# Patient Record
Sex: Male | Born: 1965
Health system: Southern US, Community
[De-identification: ages and names within clinical notes are randomized; demographics above are authoritative.]

---

## 2000-04-29 ENCOUNTER — Ambulatory Visit (HOSPITAL_COMMUNITY): Admission: RE | Admit: 2000-04-29 | Discharge: 2000-04-29 | Payer: Self-pay | Admitting: Gastroenterology

## 2003-11-28 ENCOUNTER — Encounter: Admission: RE | Admit: 2003-11-28 | Discharge: 2003-11-28 | Payer: Self-pay | Admitting: Family Medicine

## 2011-09-28 ENCOUNTER — Ambulatory Visit: Payer: Self-pay | Admitting: Family Medicine

## 2015-11-05 DIAGNOSIS — Z125 Encounter for screening for malignant neoplasm of prostate: Secondary | ICD-10-CM | POA: Diagnosis not present

## 2015-11-05 DIAGNOSIS — E785 Hyperlipidemia, unspecified: Secondary | ICD-10-CM | POA: Diagnosis not present

## 2015-11-05 DIAGNOSIS — Z0000 Encounter for general adult medical examination without abnormal findings: Secondary | ICD-10-CM | POA: Diagnosis not present

## 2015-11-20 DIAGNOSIS — Z8719 Personal history of other diseases of the digestive system: Secondary | ICD-10-CM | POA: Diagnosis not present

## 2016-03-09 DIAGNOSIS — M791 Myalgia: Secondary | ICD-10-CM | POA: Diagnosis not present

## 2016-03-09 DIAGNOSIS — M7542 Impingement syndrome of left shoulder: Secondary | ICD-10-CM | POA: Diagnosis not present

## 2016-03-09 DIAGNOSIS — M25512 Pain in left shoulder: Secondary | ICD-10-CM | POA: Diagnosis not present

## 2016-03-12 DIAGNOSIS — M25512 Pain in left shoulder: Secondary | ICD-10-CM | POA: Diagnosis not present

## 2016-03-12 DIAGNOSIS — M7542 Impingement syndrome of left shoulder: Secondary | ICD-10-CM | POA: Diagnosis not present

## 2016-03-12 DIAGNOSIS — M791 Myalgia: Secondary | ICD-10-CM | POA: Diagnosis not present

## 2016-03-18 DIAGNOSIS — M7542 Impingement syndrome of left shoulder: Secondary | ICD-10-CM | POA: Diagnosis not present

## 2016-03-18 DIAGNOSIS — M25512 Pain in left shoulder: Secondary | ICD-10-CM | POA: Diagnosis not present

## 2016-03-18 DIAGNOSIS — M791 Myalgia: Secondary | ICD-10-CM | POA: Diagnosis not present

## 2016-03-24 DIAGNOSIS — M791 Myalgia: Secondary | ICD-10-CM | POA: Diagnosis not present

## 2016-03-24 DIAGNOSIS — M7542 Impingement syndrome of left shoulder: Secondary | ICD-10-CM | POA: Diagnosis not present

## 2016-03-24 DIAGNOSIS — M25512 Pain in left shoulder: Secondary | ICD-10-CM | POA: Diagnosis not present

## 2016-03-26 DIAGNOSIS — M7542 Impingement syndrome of left shoulder: Secondary | ICD-10-CM | POA: Diagnosis not present

## 2016-03-26 DIAGNOSIS — M25512 Pain in left shoulder: Secondary | ICD-10-CM | POA: Diagnosis not present

## 2016-03-26 DIAGNOSIS — M791 Myalgia: Secondary | ICD-10-CM | POA: Diagnosis not present

## 2016-04-08 DIAGNOSIS — M25512 Pain in left shoulder: Secondary | ICD-10-CM | POA: Diagnosis not present

## 2016-04-08 DIAGNOSIS — M791 Myalgia: Secondary | ICD-10-CM | POA: Diagnosis not present

## 2016-04-08 DIAGNOSIS — M7542 Impingement syndrome of left shoulder: Secondary | ICD-10-CM | POA: Diagnosis not present

## 2016-04-27 DIAGNOSIS — M791 Myalgia: Secondary | ICD-10-CM | POA: Diagnosis not present

## 2016-04-27 DIAGNOSIS — M7542 Impingement syndrome of left shoulder: Secondary | ICD-10-CM | POA: Diagnosis not present

## 2016-04-27 DIAGNOSIS — M25512 Pain in left shoulder: Secondary | ICD-10-CM | POA: Diagnosis not present

## 2016-10-27 DIAGNOSIS — R6889 Other general symptoms and signs: Secondary | ICD-10-CM | POA: Diagnosis not present

## 2016-10-27 DIAGNOSIS — J111 Influenza due to unidentified influenza virus with other respiratory manifestations: Secondary | ICD-10-CM | POA: Diagnosis not present

## 2016-11-09 DIAGNOSIS — E7800 Pure hypercholesterolemia, unspecified: Secondary | ICD-10-CM | POA: Diagnosis not present

## 2016-11-09 DIAGNOSIS — Z0000 Encounter for general adult medical examination without abnormal findings: Secondary | ICD-10-CM | POA: Diagnosis not present

## 2016-11-09 DIAGNOSIS — Z125 Encounter for screening for malignant neoplasm of prostate: Secondary | ICD-10-CM | POA: Diagnosis not present

## 2016-11-09 DIAGNOSIS — Z1211 Encounter for screening for malignant neoplasm of colon: Secondary | ICD-10-CM | POA: Diagnosis not present

## 2016-12-10 DIAGNOSIS — Z209 Contact with and (suspected) exposure to unspecified communicable disease: Secondary | ICD-10-CM | POA: Diagnosis not present

## 2016-12-10 DIAGNOSIS — Z23 Encounter for immunization: Secondary | ICD-10-CM | POA: Diagnosis not present

## 2016-12-10 DIAGNOSIS — E7800 Pure hypercholesterolemia, unspecified: Secondary | ICD-10-CM | POA: Diagnosis not present

## 2017-01-11 DIAGNOSIS — Z23 Encounter for immunization: Secondary | ICD-10-CM | POA: Diagnosis not present

## 2017-02-02 ENCOUNTER — Emergency Department (HOSPITAL_COMMUNITY): Payer: BLUE CROSS/BLUE SHIELD

## 2017-02-02 ENCOUNTER — Emergency Department (HOSPITAL_COMMUNITY)
Admission: EM | Admit: 2017-02-02 | Discharge: 2017-02-02 | Disposition: A | Discharge status: Home / Self Care | Payer: BLUE CROSS/BLUE SHIELD | Attending: Emergency Medicine | Admitting: Emergency Medicine

## 2017-02-02 ENCOUNTER — Encounter (HOSPITAL_COMMUNITY): Payer: Self-pay | Admitting: Emergency Medicine

## 2017-02-02 DIAGNOSIS — K5792 Diverticulitis of intestine, part unspecified, without perforation or abscess without bleeding: Secondary | ICD-10-CM | POA: Diagnosis not present

## 2017-02-02 DIAGNOSIS — M87052 Idiopathic aseptic necrosis of left femur: Secondary | ICD-10-CM | POA: Diagnosis not present

## 2017-02-02 DIAGNOSIS — M8700 Idiopathic aseptic necrosis of unspecified bone: Secondary | ICD-10-CM

## 2017-02-02 DIAGNOSIS — M87051 Idiopathic aseptic necrosis of right femur: Secondary | ICD-10-CM | POA: Diagnosis not present

## 2017-02-02 DIAGNOSIS — K5730 Diverticulosis of large intestine without perforation or abscess without bleeding: Secondary | ICD-10-CM | POA: Diagnosis not present

## 2017-02-02 DIAGNOSIS — I96 Gangrene, not elsewhere classified: Secondary | ICD-10-CM | POA: Diagnosis not present

## 2017-02-02 DIAGNOSIS — R1032 Left lower quadrant pain: Secondary | ICD-10-CM | POA: Diagnosis not present

## 2017-02-02 DIAGNOSIS — K5732 Diverticulitis of large intestine without perforation or abscess without bleeding: Secondary | ICD-10-CM | POA: Diagnosis not present

## 2017-02-02 LAB — COMPREHENSIVE METABOLIC PANEL
ALBUMIN: 4.9 g/dL (ref 3.5–5.0)
ALT: 39 U/L (ref 17–63)
AST: 29 U/L (ref 15–41)
Alkaline Phosphatase: 60 U/L (ref 38–126)
Anion gap: 10 (ref 5–15)
BILIRUBIN TOTAL: 1.7 mg/dL — AB (ref 0.3–1.2)
BUN: 16 mg/dL (ref 6–20)
CHLORIDE: 104 mmol/L (ref 101–111)
CO2: 25 mmol/L (ref 22–32)
Calcium: 9.5 mg/dL (ref 8.9–10.3)
Creatinine, Ser: 1.02 mg/dL (ref 0.61–1.24)
GFR calc Af Amer: >60 mL/min (ref >60)
GFR calc non Af Amer: >60 mL/min (ref >60)
GLUCOSE: 112 mg/dL — AB (ref 65–99)
POTASSIUM: 3.8 mmol/L (ref 3.5–5.1)
Sodium: 139 mmol/L (ref 135–145)
Total Protein: 7.9 g/dL (ref 6.5–8.1)

## 2017-02-02 LAB — CBC
HEMATOCRIT: 38.9 % — AB (ref 39.0–52.0)
Hemoglobin: 13.5 g/dL (ref 13.0–17.0)
MCH: 30.6 pg (ref 26.0–34.0)
MCHC: 34.7 g/dL (ref 30.0–36.0)
MCV: 88.2 fL (ref 78.0–100.0)
Platelets: 236 10*3/uL (ref 150–400)
RBC: 4.41 MIL/uL (ref 4.22–5.81)
RDW: 11.9 % (ref 11.5–15.5)
WBC: 11.8 10*3/uL — ABNORMAL HIGH (ref 4.0–10.5)

## 2017-02-02 LAB — URINALYSIS, ROUTINE W REFLEX MICROSCOPIC
BILIRUBIN URINE: NEGATIVE
GLUCOSE, UA: NEGATIVE mg/dL
Hgb urine dipstick: NEGATIVE
KETONES UR: NEGATIVE mg/dL
Leukocytes, UA: NEGATIVE
Nitrite: NEGATIVE
PH: 5 (ref 5.0–8.0)
Protein, ur: NEGATIVE mg/dL
Specific Gravity, Urine: 1.008 (ref 1.005–1.030)

## 2017-02-02 LAB — LIPASE, BLOOD: LIPASE: 19 U/L (ref 11–51)

## 2017-02-02 MED ORDER — METRONIDAZOLE 500 MG PO TABS: 500.0000 mg | ORAL_TABLET | Freq: Two times a day (BID) | ORAL | 0 refills | Status: AC

## 2017-02-02 MED ORDER — ONDANSETRON 8 MG PO TBDP: 8.0000 mg | ORAL_TABLET | Freq: Three times a day (TID) | ORAL | 0 refills | Status: AC | PRN

## 2017-02-02 MED ORDER — CIPROFLOXACIN HCL 500 MG PO TABS
500.0000 mg | ORAL_TABLET | Freq: Once | ORAL | Status: AC
  Administered 2017-02-02: 500 mg via ORAL
  Filled 2017-02-02: qty 1

## 2017-02-02 MED ORDER — IOPAMIDOL (ISOVUE-300) INJECTION 61%
100.0000 mL | Freq: Once | INTRAVENOUS | Status: AC | PRN
  Administered 2017-02-02: 100 mL via INTRAVENOUS

## 2017-02-02 MED ORDER — HYDROCODONE-ACETAMINOPHEN 5-325 MG PO TABS
1.0000 | ORAL_TABLET | Freq: Once | ORAL | Status: AC
Start: 2017-02-02 — End: 2017-02-02
  Administered 2017-02-02: 1 via ORAL
  Filled 2017-02-02: qty 1

## 2017-02-02 MED ORDER — MORPHINE SULFATE (PF) 2 MG/ML IV SOLN
4.0000 mg | Freq: Once | INTRAVENOUS | Status: AC
  Administered 2017-02-02: 4 mg via INTRAVENOUS
  Filled 2017-02-02: qty 2

## 2017-02-02 MED ORDER — CIPROFLOXACIN HCL 500 MG PO TABS: 500.0000 mg | ORAL_TABLET | Freq: Three times a day (TID) | ORAL | 0 refills | Status: AC

## 2017-02-02 MED ORDER — MORPHINE SULFATE (PF) 2 MG/ML IV SOLN
4.0000 mg | Freq: Once | INTRAVENOUS | Status: AC
Start: 2017-02-02 — End: 2017-02-02
  Administered 2017-02-02: 4 mg via INTRAVENOUS
  Filled 2017-02-02: qty 2

## 2017-02-02 MED ORDER — SODIUM CHLORIDE 0.9 % IV BOLUS (SEPSIS)
500.0000 mL | Freq: Once | INTRAVENOUS | Status: AC
  Administered 2017-02-02: 500 mL via INTRAVENOUS

## 2017-02-02 MED ORDER — IOPAMIDOL (ISOVUE-300) INJECTION 61%
INTRAVENOUS | Status: AC
  Filled 2017-02-02: qty 100

## 2017-02-02 MED ORDER — OXYCODONE HCL 5 MG PO TABS: 5.0000 mg | ORAL_TABLET | ORAL | 0 refills | Status: AC | PRN

## 2017-02-02 MED ORDER — ONDANSETRON HCL 4 MG/2ML IJ SOLN
4.0000 mg | Freq: Once | INTRAMUSCULAR | Status: AC
  Administered 2017-02-02: 4 mg via INTRAVENOUS
  Filled 2017-02-02: qty 2

## 2017-02-02 MED ORDER — METRONIDAZOLE 500 MG PO TABS
500.0000 mg | ORAL_TABLET | Freq: Once | ORAL | Status: AC
  Administered 2017-02-02: 500 mg via ORAL
  Filled 2017-02-02: qty 1

## 2017-02-02 NOTE — Discharge Instructions (Signed)
Have discussed your CAT scan findings with you. You are going to be treated for diverticulitis. Make sure you take the antibiotics as prescribed. Had been giving your first dose of antibiotics in the ED. Use the pain medicine as needed. May take Tylenol and Motrin as needed for pain. May use Zofran as needed for nausea. She drink plenty of fluids. Bowel rest for the first 24-48 hours with clear liquid diet and advance as tolerated. Patient to follow up with her GI doctor and your primary care doctor. Return to the ED if he develops any worsening symptoms.

## 2017-02-02 NOTE — ED Provider Notes (Signed)
[facility]-EMERGENCY DEPT Provider Note   CSN: [id number] Arrival date & time: 02/02/17  0844     History   Chief Complaint Chief Complaint  Patient presents with  . Abdominal Pain    HPI Tyrone Brown is a 51 y.o. male.  HPI 51 yo Caucasian male with no sign pmh presents to the ED with complaint of LLQ abd pain. Pt states the pain started last night. Describes it as sharp, stabbing, constant and gradually worsening. Pt has not tried any medication at home for his pain. Nothing makes better. Moving or walking makes the pain worse.   Pt denies any fever, chill, ha, vision changes, lightheadedness, dizziness, congestion, neck pain, cp, sob, cough, n/v/d, urinary symptoms, change in bowel habits, melena, hematochezia, lower extremity paresthesias.  History reviewed. No pertinent past medical history.  There are no active problems to display for this patient.   History reviewed. No pertinent surgical history.     Home Medications    Prior to Admission medications   Not on File    Family History No family history on file.  Social History Social History  Substance Use Topics  . Smoking status: Not on file  . Smokeless tobacco: Not on file  . Alcohol use Not on file     Allergies   Patient has no known allergies.   Review of Systems Review of Systems  Constitutional: Negative for chills and fever.  HENT: Negative for congestion and sore throat.   Eyes: Negative for visual disturbance.  Respiratory: Negative for cough and shortness of breath.   Cardiovascular: Negative for chest pain.  Gastrointestinal: Positive for abdominal pain. Negative for diarrhea, nausea and vomiting.  Genitourinary: Negative for dysuria, flank pain, frequency, hematuria, scrotal swelling, testicular pain and urgency.  Musculoskeletal: Negative for arthralgias and myalgias.  Skin: Negative for rash.  Neurological: Negative for dizziness, syncope, weakness, light-headedness, numbness and  headaches.  Psychiatric/Behavioral: Negative for sleep disturbance. The patient is not nervous/anxious.      Physical Exam Updated Vital Signs BP 113/84 (BP Location: Left Arm)   Pulse 89   Temp 98.3 F (36.8 C) (Oral)   Resp 18   SpO2 96%   Physical Exam  Constitutional: He is oriented to person, place, and time. He appears well-developed and well-nourished.  Non-toxic appearance. No distress.  HENT:  Head: Normocephalic and atraumatic.  Mouth/Throat: Oropharynx is clear and moist.  Eyes: Conjunctivae are normal. Pupils are equal, round, and reactive to light. Right eye exhibits no discharge. Left eye exhibits no discharge.  Neck: Normal range of motion. Neck supple.  Cardiovascular: Normal rate, regular rhythm, normal heart sounds and intact distal pulses.  Exam reveals no gallop and no friction rub.   No murmur heard. Pulmonary/Chest: Effort normal and breath sounds normal. No respiratory distress. He exhibits no tenderness.  Abdominal: Soft. Bowel sounds are normal. He exhibits no distension. There is tenderness in the left lower quadrant. There is guarding (voluntary). There is no rigidity, no rebound, no CVA tenderness, no tenderness at McBurney's point and negative Murphy's sign.  Musculoskeletal: Normal range of motion. He exhibits no tenderness.  Lymphadenopathy:    He has no cervical adenopathy.  Neurological: He is alert and oriented to person, place, and time.  Skin: Skin is warm and dry. Capillary refill takes less than 2 seconds. No rash noted.  Psychiatric: His behavior is normal. Judgment and thought content normal.  Nursing note and vitals reviewed.    ED Treatments / Results  Labs (  all labs ordered are listed, but only abnormal results are displayed) Labs Reviewed  COMPREHENSIVE METABOLIC PANEL - Abnormal; Notable for the following:       Result Value   Glucose, Bld 112 (*)    Total Bilirubin 1.7 (*)    All other components within normal limits  CBC -  Abnormal; Notable for the following:    WBC 11.8 (*)    HCT 38.9 (*)    All other components within normal limits  URINALYSIS, ROUTINE W REFLEX MICROSCOPIC - Abnormal; Notable for the following:    Color, Urine STRAW (*)    All other components within normal limits  LIPASE, BLOOD    EKG  EKG Interpretation None       Radiology Ct Abdomen Pelvis W Contrast  Result Date: 02/02/2017 CLINICAL DATA:  Left lower quadrant region pain EXAM: CT ABDOMEN AND PELVIS WITH CONTRAST TECHNIQUE: Multidetector CT imaging of the abdomen and pelvis was performed using the standard protocol following bolus administration of intravenous contrast. CONTRAST:  ISOVUE-300 IOPAMIDOL (ISOVUE-300) INJECTION 61% COMPARISON:  None. FINDINGS: Lower chest: Lung bases are clear. Hepatobiliary: No focal liver lesions are appreciable. Gallbladder wall does not appear thickened. There is no biliary duct dilatation. Pancreas: There is no pancreatic mass or inflammatory focus. Spleen: No splenic lesions are evident. Adrenals/Urinary Tract: Adrenals appear normal bilaterally. Kidneys bilaterally show no evident mass. There is an extrarenal pelvis on each side, an anatomic variant. There is no appreciable hydronephrosis on either side. There is no renal or ureteral calculus on either side. The urinary bladder is midline with wall thickness within normal limits. Stomach/Bowel: There is wall thickening in the proximal sigmoid colon region with an irregular diverticulum in the proximal sigmoid colon region. There is adjacent mesenteric fluid and stranding consistent with diverticulitis in the proximal descending colon region. There is thickening at the adjacent lateral conal fascia. There is no abscess or perforation in this area. More distally in the sigmoid colon, there are multiple diverticula without inflammation. Elsewhere, there is no bowel wall or mesenteric thickening. There is no bowel obstruction. No free air or portal  venous air. Vascular/Lymphatic: There are foci of atherosclerotic calcification in the aorta. No abdominal aortic aneurysm. Major mesenteric vessels appear patent. Reproductive: Prostate and seminal vesicles appear normal in size and contour. There is no pelvic mass evident. Other: Appendix not appreciable. No periappendiceal region inflammation. No abscess or ascites noted in the abdomen or pelvis. There is a minimal ventral hernia containing only fat. Musculoskeletal: There are foci of calcification in each femoral head, areas felt to be indicative of a degree of bilateral femoral head avascular necrosis. These changes are more extensive on the right than on the left. There is evidence of old trauma with exostosis formation along the anterior right pubic symphysis. No blastic or lytic bone lesions are identified. No intramuscular or abdominal wall lesions. IMPRESSION: 1. Proximal sigmoid colon diverticulitis with localized wall thickening and fluid but no frank abscess or perforation. Irregular diverticulum noted in this area. 2. Multiple diverticula throughout the mid and distal sigmoid colon without diverticulitis more distally. 3. No bowel obstruction. No abscess. No periappendiceal region inflammation. 4. Findings indicative of a degree of avascular necrosis in each femoral head, somewhat more extensive on the right than on the left. 5. Evidence of old trauma involving the right pubic symphysis with an exostosis anteriorly in this area, benign in appearance. 6.  Aortic atherosclerosis. 7.  No renal or ureteral calculus.  No hydronephrosis.  8.  Minimal ventral hernia containing only fat. Aortic Atherosclerosis (ICD10-I70.0). Electronically Signed   By: Bretta Bang III M.D.   On: 02/02/2017 11:30    Procedures Procedures (including critical care time)  Medications Ordered in ED Medications  morphine 2 MG/ML injection 4 mg (4 mg Intravenous Given 02/02/17 0957)  sodium chloride 0.9 % bolus 500 mL (0  mLs Intravenous Stopped 02/02/17 1138)  ondansetron (ZOFRAN) injection 4 mg (4 mg Intravenous Given 02/02/17 0957)  iopamidol (ISOVUE-300) 61 % injection 100 mL (100 mLs Intravenous Contrast Given 02/02/17 1107)  morphine 2 MG/ML injection 4 mg (4 mg Intravenous Given 02/02/17 1224)  HYDROcodone-acetaminophen (NORCO/VICODIN) 5-325 MG per tablet 1 tablet (1 tablet Oral Given 02/02/17 1224)  ciprofloxacin (CIPRO) tablet 500 mg (500 mg Oral Given 02/02/17 1224)  metroNIDAZOLE (FLAGYL) tablet 500 mg (500 mg Oral Given 02/02/17 1224)     Initial Impression / Assessment and Plan / ED Course  I have reviewed the triage vital signs and the nursing notes.  Pertinent labs & imaging results that were available during my care of the patient were reviewed by me and considered in my medical decision making (see chart for details).     Patient presents to the emergency Department today with complaints of left lower quadrant abdominal pain. He denies any associated symptoms including fever, nausea, vomiting, diarrhea. Patient does have left lower quadrant tenderness to palpation. No peritoneal signs concerning for surgical abdomen. Patient does not meet SIRS or SEPSIS criteria. Patient is afebrile. No tachycardia or hypotension. Patient with mild leukocytosis of 11,000. All other labs unremarkable. UA shows no signs of infection. Concerning for diverticulitis. We'll obtain CT scan.  CT scan does show uncomplicated diverticulitis or abscess or perforation. Other CT findings discussed with patient including signs of avascular necrosis.  Encourage patient to follow-up with his primary care doctor. Patient is able to keep down by mouth fluids or food. Pain has been managed in the ED. Repeat abdominal exam is benign without any signs of peritonitis. Patient given first dose of antibiotic in the ED. Will be discharged home with antibiotic and pain medicine. Encourage patient to follow-up with his GI doctor and PCP. Patient  verbalized understanding the plan of care and all questions were answered prior to discharge. Vital signs remained stable and he is hemodynamically stable on discharge.  Dicussed pt with Dr. Jacqulyn Bath who is agreeable to the above plan.   Final Clinical Impressions(s) / ED Diagnoses   Final diagnoses:  Diverticulitis  Avascular necrosis (HCC)    New Prescriptions Discharge Medication List as of 02/02/2017 12:29 PM    START taking these medications   Details  ciprofloxacin (CIPRO) 500 MG tablet Take 1 tablet (500 mg total) by mouth 3 (three) times daily., Starting Tue 02/02/2017, Print    metroNIDAZOLE (FLAGYL) 500 MG tablet Take 1 tablet (500 mg total) by mouth 2 (two) times daily with a meal. DO NOT CONSUME ALCOHOL WHILE TAKING THIS MEDICATION., Starting Tue 02/02/2017, Print    ondansetron (ZOFRAN-ODT) 8 MG disintegrating tablet Take 1 tablet (8 mg total) by mouth every 8 (eight) hours as needed for nausea., Starting Tue 02/02/2017, Print    oxyCODONE (ROXICODONE) 5 MG immediate release tablet Take 1 tablet (5 mg total) by mouth every 4 (four) hours as needed for severe pain., Starting Tue 02/02/2017, Print         Rise Mu, PA-C 02/02/17 1615    Maia Plan, MD 02/02/17 [phone]

## 2017-02-02 NOTE — ED Triage Notes (Signed)
Patient c/o constant LLQ pain since last night. Denies N/V/D.

## 2017-02-18 DIAGNOSIS — K5732 Diverticulitis of large intestine without perforation or abscess without bleeding: Secondary | ICD-10-CM | POA: Diagnosis not present

## 2017-03-02 DIAGNOSIS — Z23 Encounter for immunization: Secondary | ICD-10-CM | POA: Diagnosis not present

## 2017-04-13 DIAGNOSIS — Z8719 Personal history of other diseases of the digestive system: Secondary | ICD-10-CM | POA: Diagnosis not present

## 2017-04-13 DIAGNOSIS — K5792 Diverticulitis of intestine, part unspecified, without perforation or abscess without bleeding: Secondary | ICD-10-CM | POA: Diagnosis not present

## 2017-06-18 DIAGNOSIS — Z23 Encounter for immunization: Secondary | ICD-10-CM | POA: Diagnosis not present

## 2017-07-07 DIAGNOSIS — K529 Noninfective gastroenteritis and colitis, unspecified: Secondary | ICD-10-CM | POA: Diagnosis not present

## 2017-07-07 DIAGNOSIS — K640 First degree hemorrhoids: Secondary | ICD-10-CM | POA: Diagnosis not present

## 2017-07-14 DIAGNOSIS — K529 Noninfective gastroenteritis and colitis, unspecified: Secondary | ICD-10-CM | POA: Diagnosis not present

## 2017-07-31 DIAGNOSIS — J069 Acute upper respiratory infection, unspecified: Secondary | ICD-10-CM | POA: Diagnosis not present

## 2017-07-31 DIAGNOSIS — J029 Acute pharyngitis, unspecified: Secondary | ICD-10-CM | POA: Diagnosis not present

## 2017-09-29 DIAGNOSIS — H01001 Unspecified blepharitis right upper eyelid: Secondary | ICD-10-CM | POA: Diagnosis not present

## 2017-11-11 DIAGNOSIS — E7800 Pure hypercholesterolemia, unspecified: Secondary | ICD-10-CM | POA: Diagnosis not present

## 2017-11-11 DIAGNOSIS — Z125 Encounter for screening for malignant neoplasm of prostate: Secondary | ICD-10-CM | POA: Diagnosis not present

## 2017-11-11 DIAGNOSIS — Z0000 Encounter for general adult medical examination without abnormal findings: Secondary | ICD-10-CM | POA: Diagnosis not present

## 2018-05-01 ENCOUNTER — Emergency Department (HOSPITAL_COMMUNITY)
Admission: EM | Admit: 2018-05-01 | Discharge: 2018-05-01 | Disposition: A | Discharge status: Home / Self Care | Payer: BLUE CROSS/BLUE SHIELD | Attending: Emergency Medicine | Admitting: Emergency Medicine

## 2018-05-01 ENCOUNTER — Other Ambulatory Visit: Payer: Self-pay

## 2018-05-01 DIAGNOSIS — Y9389 Activity, other specified: Secondary | ICD-10-CM | POA: Diagnosis not present

## 2018-05-01 DIAGNOSIS — Y999 Unspecified external cause status: Secondary | ICD-10-CM | POA: Insufficient documentation

## 2018-05-01 DIAGNOSIS — Y929 Unspecified place or not applicable: Secondary | ICD-10-CM | POA: Diagnosis not present

## 2018-05-01 DIAGNOSIS — S51811A Laceration without foreign body of right forearm, initial encounter: Secondary | ICD-10-CM | POA: Diagnosis not present

## 2018-05-01 DIAGNOSIS — W271XXA Contact with garden tool, initial encounter: Secondary | ICD-10-CM | POA: Insufficient documentation

## 2018-05-01 DIAGNOSIS — Z79899 Other long term (current) drug therapy: Secondary | ICD-10-CM | POA: Insufficient documentation

## 2018-05-01 DIAGNOSIS — Z23 Encounter for immunization: Secondary | ICD-10-CM | POA: Diagnosis not present

## 2018-05-01 MED ORDER — TETANUS-DIPHTH-ACELL PERTUSSIS 5-2.5-18.5 LF-MCG/0.5 IM SUSP
0.5000 mL | Freq: Once | INTRAMUSCULAR | Status: AC
  Administered 2018-05-01: 0.5 mL via INTRAMUSCULAR
  Filled 2018-05-01: qty 0.5

## 2018-05-01 MED ORDER — LIDOCAINE-EPINEPHRINE (PF) 2 %-1:200000 IJ SOLN
10.0000 mL | Freq: Once | INTRAMUSCULAR | Status: AC
  Administered 2018-05-01: 10 mL
  Filled 2018-05-01: qty 20

## 2018-05-01 NOTE — ED Provider Notes (Signed)
[facility]-EMERGENCY DEPT Provider Note   CSN: [id number] Arrival date & time: 05/01/18  1251     History   Chief Complaint Chief Complaint  Patient presents with  . Extremity Laceration    HPI Tyrone Brown is a 52 y.o. male.  Tyrone Brown is a 52 y.o. y.o. Male presents for evaluation of extremity laceration.  Patient reports this afternoon he was working outside when he cut his right forearm with a pair of garden shears when they slipped while working in the yard.  They did not go very deep laceration appears fairly superficial.  He has no numbness or weakness, no difficulty moving the elbow, wrist or hand.  He is unsure when his last tetanus shot was.  Reports some mild pain immediately around the laceration, worse with palpation, no medications prior to arrival.  No other aggravating or alleviating factors.      No past medical history on file.  There are no active problems to display for this patient.   No past surgical history on file.      Home Medications    Prior to Admission medications   Medication Sig Start Date End Date Taking? Authorizing Provider  atorvastatin (LIPITOR) 10 MG tablet Take 10 mg by mouth at bedtime. 11/09/16   [provider]  ciprofloxacin (CIPRO) 500 MG tablet Take 1 tablet (500 mg total) by mouth 3 (three) times daily. 02/02/17   Rise Mu, PA-C  metroNIDAZOLE (FLAGYL) 500 MG tablet Take 1 tablet (500 mg total) by mouth 2 (two) times daily with a meal. DO NOT CONSUME ALCOHOL WHILE TAKING THIS MEDICATION. 02/02/17   Rise Mu, PA-C  Multiple Vitamin (MULTIVITAMIN WITH MINERALS) TABS tablet Take 1 tablet by mouth daily.    [provider]  ondansetron (ZOFRAN-ODT) 8 MG disintegrating tablet Take 1 tablet (8 mg total) by mouth every 8 (eight) hours as needed for nausea. 02/02/17   Rise Mu, PA-C  oxyCODONE (ROXICODONE) 5 MG immediate release tablet Take 1 tablet (5 mg total) by  mouth every 4 (four) hours as needed for severe pain. 02/02/17   Rise Mu, PA-C    Family History No family history on file.  Social History Social History   Tobacco Use  . Smoking status: Not on file  Substance Use Topics  . Alcohol use: Not on file  . Drug use: Not on file     Allergies   Patient has no known allergies.   Review of Systems Review of Systems  Constitutional: Negative for chills and fever.  Skin: Positive for wound.  Neurological: Negative for weakness and numbness.     Physical Exam Updated Vital Signs BP 117/78 (BP Location: Right Arm)   Pulse 80   Temp 98.3 F (36.8 C) (Oral)   Resp 18   Ht 5\' 11"  (1.803 m)   Wt 81.6 kg   BMI 25.10 kg/m   Physical Exam  Constitutional: He appears well-developed and well-nourished. No distress.  HENT:  Head: Normocephalic and atraumatic.  Mouth/Throat: Oropharynx is clear and moist.  Eyes: Right eye exhibits no discharge. Left eye exhibits no discharge.  Neck: Neck supple.  Pulmonary/Chest: Effort normal. No respiratory distress.  Musculoskeletal:  3.5 cm laceration to the mid right forearm, bleeding controlled, patient has 5/5 grip strength and normal movement of all fingers, sensation is intact throughout the arm.  No evidence of foreign bodies and the wound is fairly superficial.  Neurological: He is alert. Coordination  normal.  Skin: Skin is warm and dry. Capillary refill takes less than 2 seconds. He is not diaphoretic.  Psychiatric: He has a normal mood and affect. His behavior is normal.  Nursing note and vitals reviewed.    ED Treatments / Results  Labs (all labs ordered are listed, but only abnormal results are displayed) Labs Reviewed - No data to display  EKG None  Radiology No results found.  Procedures .Marland KitchenLaceration Repair Date/Time: 05/01/2018 2:40 PM Performed by: Dartha Lodge, PA-C Authorized by: Dartha Lodge, PA-C   Consent:    Consent obtained:  Verbal    Consent given by:  Patient   Risks discussed:  Infection, pain, need for additional repair, poor cosmetic result and poor wound healing   Alternatives discussed:  No treatment Anesthesia (see MAR for exact dosages):    Anesthesia method:  Local infiltration   Local anesthetic:  Lidocaine 2% WITH epi Laceration details:    Location:  Shoulder/arm   Shoulder/arm location:  R lower arm   Length (cm):  3.5   Depth (mm):  5 Repair type:    Repair type:  Simple Pre-procedure details:    Preparation:  Imaging obtained to evaluate for foreign bodies Exploration:    Hemostasis achieved with:  LET and direct pressure   Wound exploration: entire depth of wound probed and visualized     Wound extent: areolar tissue violated     Wound extent: no nerve damage noted and no tendon damage noted   Treatment:    Area cleansed with:  Saline   Amount of cleaning:  Standard   Irrigation solution:  Sterile saline Skin repair:    Repair method:  Sutures   Suture size:  4-0   Suture material:  Prolene   Suture technique:  Simple interrupted   Number of sutures:  4 Approximation:    Approximation:  Close Post-procedure details:    Dressing:  Antibiotic ointment and adhesive bandage   Patient tolerance of procedure:  Tolerated well, no immediate complications   (including critical care time)  Medications Ordered in ED Medications  lidocaine-EPINEPHrine (XYLOCAINE W/EPI) 2 %-1:200000 (PF) injection 10 mL (10 mLs Infiltration Given 05/01/18 1416)  Tdap (BOOSTRIX) injection 0.5 mL (0.5 mLs Intramuscular Given 05/01/18 1417)     Initial Impression / Assessment and Plan / ED Course  I have reviewed the triage vital signs and the nursing notes.  Pertinent labs & imaging results that were available during my care of the patient were reviewed by me and considered in my medical decision making (see chart for details).  Patient presents for laceration to the right forearm, appears fairly superficial,  bleeding is controlled.  No evidence of nerve or tendon damage in the arm is neurovascularly intact.  Tetanus updated here in the ED.  The area was cleaned, and anesthetized with lidocaine.  Wound repaired with simple interrupted sutures and tolerated extremely well by the patient.  Good cosmesis.  Suture removal in 7 to 10 days. Discussed appropriate wound care and infection return precautions.  Patient expresses understanding and is in agreement with plan.  Discharge home.  Final Clinical Impressions(s) / ED Diagnoses   Final diagnoses:  Laceration of right forearm, initial encounter    ED Discharge Orders    None       Dartha Lodge, [place] 05/01/18 1453    Virgina Norfolk, DO 05/01/18 1857

## 2018-05-01 NOTE — ED Triage Notes (Signed)
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 Patient is rating his pain at 3/10 at this time.

## 2018-05-01 NOTE — Discharge Instructions (Signed)
You will need to have your stitches removed in 7 to 10 days.  In the meantime he can keep the area clean and dry, covered with antibiotic ointment and a bandage.  You may shower but do not submerge the arm underwater.  Monitor closely for signs of infection such as redness, warmth, swelling, increasing pain, drainage or fevers if any of these occur please return for sooner reevaluation.

## 2018-05-10 DIAGNOSIS — Z4802 Encounter for removal of sutures: Secondary | ICD-10-CM | POA: Diagnosis not present

## 2018-05-10 DIAGNOSIS — Z23 Encounter for immunization: Secondary | ICD-10-CM | POA: Diagnosis not present

## 2018-05-10 DIAGNOSIS — R002 Palpitations: Secondary | ICD-10-CM | POA: Diagnosis not present

## 2018-11-16 DIAGNOSIS — S83242A Other tear of medial meniscus, current injury, left knee, initial encounter: Secondary | ICD-10-CM | POA: Diagnosis not present

## 2018-11-21 DIAGNOSIS — Z0000 Encounter for general adult medical examination without abnormal findings: Secondary | ICD-10-CM | POA: Diagnosis not present

## 2018-12-08 DIAGNOSIS — L570 Actinic keratosis: Secondary | ICD-10-CM | POA: Diagnosis not present

## 2018-12-08 DIAGNOSIS — D2272 Melanocytic nevi of left lower limb, including hip: Secondary | ICD-10-CM | POA: Diagnosis not present

## 2018-12-08 DIAGNOSIS — L820 Inflamed seborrheic keratosis: Secondary | ICD-10-CM | POA: Diagnosis not present

## 2018-12-08 DIAGNOSIS — D225 Melanocytic nevi of trunk: Secondary | ICD-10-CM | POA: Diagnosis not present

## 2018-12-08 DIAGNOSIS — L814 Other melanin hyperpigmentation: Secondary | ICD-10-CM | POA: Diagnosis not present

## 2018-12-08 DIAGNOSIS — L821 Other seborrheic keratosis: Secondary | ICD-10-CM | POA: Diagnosis not present

## 2019-09-12 ENCOUNTER — Ambulatory Visit: Payer: Self-pay | Attending: Internal Medicine

## 2019-09-12 DIAGNOSIS — U071 COVID-19: Secondary | ICD-10-CM | POA: Insufficient documentation

## 2019-09-12 DIAGNOSIS — Z20822 Contact with and (suspected) exposure to covid-19: Secondary | ICD-10-CM

## 2019-09-13 LAB — NOVEL CORONAVIRUS, NAA: SARS-CoV-2, NAA: DETECTED — AB

## 2020-02-08 DIAGNOSIS — L570 Actinic keratosis: Secondary | ICD-10-CM | POA: Diagnosis not present

## 2020-02-08 DIAGNOSIS — D225 Melanocytic nevi of trunk: Secondary | ICD-10-CM | POA: Diagnosis not present

## 2020-02-08 DIAGNOSIS — D2272 Melanocytic nevi of left lower limb, including hip: Secondary | ICD-10-CM | POA: Diagnosis not present

## 2020-02-08 DIAGNOSIS — L819 Disorder of pigmentation, unspecified: Secondary | ICD-10-CM | POA: Diagnosis not present

## 2020-02-08 DIAGNOSIS — L814 Other melanin hyperpigmentation: Secondary | ICD-10-CM | POA: Diagnosis not present

## 2020-03-08 DIAGNOSIS — E7800 Pure hypercholesterolemia, unspecified: Secondary | ICD-10-CM | POA: Diagnosis not present

## 2020-03-29 DIAGNOSIS — M9905 Segmental and somatic dysfunction of pelvic region: Secondary | ICD-10-CM | POA: Diagnosis not present

## 2020-03-29 DIAGNOSIS — M9904 Segmental and somatic dysfunction of sacral region: Secondary | ICD-10-CM | POA: Diagnosis not present

## 2020-03-29 DIAGNOSIS — M9903 Segmental and somatic dysfunction of lumbar region: Secondary | ICD-10-CM | POA: Diagnosis not present

## 2020-03-29 DIAGNOSIS — M5442 Lumbago with sciatica, left side: Secondary | ICD-10-CM | POA: Diagnosis not present

## 2020-04-12 DIAGNOSIS — M5442 Lumbago with sciatica, left side: Secondary | ICD-10-CM | POA: Diagnosis not present

## 2020-04-12 DIAGNOSIS — M9905 Segmental and somatic dysfunction of pelvic region: Secondary | ICD-10-CM | POA: Diagnosis not present

## 2020-04-12 DIAGNOSIS — M9904 Segmental and somatic dysfunction of sacral region: Secondary | ICD-10-CM | POA: Diagnosis not present

## 2020-04-12 DIAGNOSIS — M9903 Segmental and somatic dysfunction of lumbar region: Secondary | ICD-10-CM | POA: Diagnosis not present

## 2020-05-08 DIAGNOSIS — M25552 Pain in left hip: Secondary | ICD-10-CM | POA: Diagnosis not present

## 2020-05-08 DIAGNOSIS — M5106 Intervertebral disc disorders with myelopathy, lumbar region: Secondary | ICD-10-CM | POA: Diagnosis not present

## 2020-05-24 DIAGNOSIS — Z23 Encounter for immunization: Secondary | ICD-10-CM | POA: Diagnosis not present

## 2020-05-24 DIAGNOSIS — F419 Anxiety disorder, unspecified: Secondary | ICD-10-CM | POA: Diagnosis not present

## 2020-07-05 DIAGNOSIS — M545 Low back pain, unspecified: Secondary | ICD-10-CM | POA: Diagnosis not present

## 2020-07-15 DIAGNOSIS — M5416 Radiculopathy, lumbar region: Secondary | ICD-10-CM | POA: Diagnosis not present

## 2020-08-01 DIAGNOSIS — M5416 Radiculopathy, lumbar region: Secondary | ICD-10-CM | POA: Diagnosis not present

## 2020-08-08 DIAGNOSIS — Z1211 Encounter for screening for malignant neoplasm of colon: Secondary | ICD-10-CM | POA: Diagnosis not present

## 2020-08-08 DIAGNOSIS — Z0000 Encounter for general adult medical examination without abnormal findings: Secondary | ICD-10-CM | POA: Diagnosis not present

## 2020-08-08 DIAGNOSIS — Z125 Encounter for screening for malignant neoplasm of prostate: Secondary | ICD-10-CM | POA: Diagnosis not present

## 2020-08-08 DIAGNOSIS — E7800 Pure hypercholesterolemia, unspecified: Secondary | ICD-10-CM | POA: Diagnosis not present

## 2020-08-08 DIAGNOSIS — M5416 Radiculopathy, lumbar region: Secondary | ICD-10-CM | POA: Diagnosis not present

## 2020-08-08 DIAGNOSIS — F419 Anxiety disorder, unspecified: Secondary | ICD-10-CM | POA: Diagnosis not present

## 2020-08-13 DIAGNOSIS — M5416 Radiculopathy, lumbar region: Secondary | ICD-10-CM | POA: Diagnosis not present

## 2020-08-29 DIAGNOSIS — M5416 Radiculopathy, lumbar region: Secondary | ICD-10-CM | POA: Diagnosis not present

## 2020-09-02 DIAGNOSIS — M5416 Radiculopathy, lumbar region: Secondary | ICD-10-CM | POA: Diagnosis not present

## 2020-09-16 DIAGNOSIS — M5416 Radiculopathy, lumbar region: Secondary | ICD-10-CM | POA: Diagnosis not present

## 2020-09-19 DIAGNOSIS — M5416 Radiculopathy, lumbar region: Secondary | ICD-10-CM | POA: Diagnosis not present

## 2020-09-25 DIAGNOSIS — L821 Other seborrheic keratosis: Secondary | ICD-10-CM | POA: Diagnosis not present

## 2020-10-22 DIAGNOSIS — M5416 Radiculopathy, lumbar region: Secondary | ICD-10-CM | POA: Diagnosis not present

## 2020-11-12 DIAGNOSIS — M5416 Radiculopathy, lumbar region: Secondary | ICD-10-CM | POA: Diagnosis not present

## 2020-11-22 DIAGNOSIS — M5416 Radiculopathy, lumbar region: Secondary | ICD-10-CM | POA: Diagnosis not present

## 2020-12-18 DIAGNOSIS — M5116 Intervertebral disc disorders with radiculopathy, lumbar region: Secondary | ICD-10-CM | POA: Diagnosis not present

## 2020-12-18 DIAGNOSIS — M5416 Radiculopathy, lumbar region: Secondary | ICD-10-CM | POA: Diagnosis not present

## 2020-12-25 ENCOUNTER — Other Ambulatory Visit (HOSPITAL_COMMUNITY): Payer: Self-pay | Admitting: Neurological Surgery

## 2020-12-25 DIAGNOSIS — M5416 Radiculopathy, lumbar region: Secondary | ICD-10-CM

## 2020-12-26 ENCOUNTER — Ambulatory Visit (HOSPITAL_COMMUNITY)
Admission: RE | Admit: 2020-12-26 | Discharge: 2020-12-26 | Disposition: A | Discharge status: Home / Self Care | Payer: BC Managed Care – PPO | Source: Ambulatory Visit | Attending: Neurological Surgery | Admitting: Neurological Surgery

## 2020-12-26 ENCOUNTER — Other Ambulatory Visit: Payer: Self-pay

## 2020-12-26 DIAGNOSIS — M4726 Other spondylosis with radiculopathy, lumbar region: Secondary | ICD-10-CM | POA: Diagnosis not present

## 2020-12-26 DIAGNOSIS — M5416 Radiculopathy, lumbar region: Secondary | ICD-10-CM | POA: Diagnosis not present

## 2020-12-26 DIAGNOSIS — M5116 Intervertebral disc disorders with radiculopathy, lumbar region: Secondary | ICD-10-CM | POA: Diagnosis not present

## 2020-12-26 DIAGNOSIS — M48061 Spinal stenosis, lumbar region without neurogenic claudication: Secondary | ICD-10-CM | POA: Diagnosis not present

## 2020-12-26 DIAGNOSIS — Z9889 Other specified postprocedural states: Secondary | ICD-10-CM | POA: Diagnosis not present

## 2021-01-03 DIAGNOSIS — Z9889 Other specified postprocedural states: Secondary | ICD-10-CM | POA: Diagnosis not present

## 2021-01-03 DIAGNOSIS — M5116 Intervertebral disc disorders with radiculopathy, lumbar region: Secondary | ICD-10-CM | POA: Diagnosis not present

## 2021-01-03 DIAGNOSIS — M5126 Other intervertebral disc displacement, lumbar region: Secondary | ICD-10-CM | POA: Diagnosis not present

## 2021-01-06 ENCOUNTER — Ambulatory Visit (HOSPITAL_COMMUNITY)
Admission: RE | Admit: 2021-01-06 | Discharge: 2021-01-06 | Disposition: A | Discharge status: Home / Self Care | Payer: BC Managed Care – PPO | Source: Ambulatory Visit | Attending: Neurosurgery | Admitting: Neurosurgery

## 2021-01-06 ENCOUNTER — Other Ambulatory Visit (HOSPITAL_COMMUNITY): Payer: Self-pay | Admitting: Neurosurgery

## 2021-01-06 ENCOUNTER — Other Ambulatory Visit (HOSPITAL_BASED_OUTPATIENT_CLINIC_OR_DEPARTMENT_OTHER): Payer: Self-pay | Admitting: Neurosurgery

## 2021-01-06 ENCOUNTER — Other Ambulatory Visit: Payer: Self-pay

## 2021-01-06 DIAGNOSIS — M5416 Radiculopathy, lumbar region: Secondary | ICD-10-CM | POA: Insufficient documentation

## 2021-01-06 DIAGNOSIS — M545 Low back pain, unspecified: Secondary | ICD-10-CM | POA: Diagnosis not present

## 2021-01-06 MED ORDER — GADOBUTROL 1 MMOL/ML IV SOLN
8.5000 mL | Freq: Once | INTRAVENOUS | Status: AC | PRN
  Administered 2021-01-06: 8.5 mL via INTRAVENOUS

## 2021-01-22 DIAGNOSIS — M79605 Pain in left leg: Secondary | ICD-10-CM | POA: Diagnosis not present

## 2021-01-22 DIAGNOSIS — M5416 Radiculopathy, lumbar region: Secondary | ICD-10-CM | POA: Diagnosis not present

## 2021-01-22 DIAGNOSIS — M6281 Muscle weakness (generalized): Secondary | ICD-10-CM | POA: Diagnosis not present

## 2021-01-29 DIAGNOSIS — M6281 Muscle weakness (generalized): Secondary | ICD-10-CM | POA: Diagnosis not present

## 2021-01-29 DIAGNOSIS — M79605 Pain in left leg: Secondary | ICD-10-CM | POA: Diagnosis not present

## 2021-01-29 DIAGNOSIS — M5416 Radiculopathy, lumbar region: Secondary | ICD-10-CM | POA: Diagnosis not present

## 2021-02-05 DIAGNOSIS — M79605 Pain in left leg: Secondary | ICD-10-CM | POA: Diagnosis not present

## 2021-02-05 DIAGNOSIS — M5416 Radiculopathy, lumbar region: Secondary | ICD-10-CM | POA: Diagnosis not present

## 2021-02-05 DIAGNOSIS — M6281 Muscle weakness (generalized): Secondary | ICD-10-CM | POA: Diagnosis not present

## 2021-02-13 DIAGNOSIS — M6281 Muscle weakness (generalized): Secondary | ICD-10-CM | POA: Diagnosis not present

## 2021-02-13 DIAGNOSIS — M5416 Radiculopathy, lumbar region: Secondary | ICD-10-CM | POA: Diagnosis not present

## 2021-02-13 DIAGNOSIS — M79605 Pain in left leg: Secondary | ICD-10-CM | POA: Diagnosis not present

## 2021-02-19 DIAGNOSIS — M5416 Radiculopathy, lumbar region: Secondary | ICD-10-CM | POA: Diagnosis not present

## 2021-02-19 DIAGNOSIS — M6281 Muscle weakness (generalized): Secondary | ICD-10-CM | POA: Diagnosis not present

## 2021-02-19 DIAGNOSIS — M79605 Pain in left leg: Secondary | ICD-10-CM | POA: Diagnosis not present

## 2021-03-02 DIAGNOSIS — M25562 Pain in left knee: Secondary | ICD-10-CM | POA: Diagnosis not present

## 2021-03-04 DIAGNOSIS — M79605 Pain in left leg: Secondary | ICD-10-CM | POA: Diagnosis not present

## 2021-03-04 DIAGNOSIS — M6281 Muscle weakness (generalized): Secondary | ICD-10-CM | POA: Diagnosis not present

## 2021-03-04 DIAGNOSIS — M5416 Radiculopathy, lumbar region: Secondary | ICD-10-CM | POA: Diagnosis not present

## 2021-03-12 DIAGNOSIS — M5416 Radiculopathy, lumbar region: Secondary | ICD-10-CM | POA: Diagnosis not present

## 2021-03-12 DIAGNOSIS — M79605 Pain in left leg: Secondary | ICD-10-CM | POA: Diagnosis not present

## 2021-03-12 DIAGNOSIS — M6281 Muscle weakness (generalized): Secondary | ICD-10-CM | POA: Diagnosis not present

## 2021-03-19 DIAGNOSIS — M6281 Muscle weakness (generalized): Secondary | ICD-10-CM | POA: Diagnosis not present

## 2021-03-19 DIAGNOSIS — M79605 Pain in left leg: Secondary | ICD-10-CM | POA: Diagnosis not present

## 2021-03-19 DIAGNOSIS — M5416 Radiculopathy, lumbar region: Secondary | ICD-10-CM | POA: Diagnosis not present

## 2021-03-26 DIAGNOSIS — M5416 Radiculopathy, lumbar region: Secondary | ICD-10-CM | POA: Diagnosis not present

## 2021-03-26 DIAGNOSIS — M6281 Muscle weakness (generalized): Secondary | ICD-10-CM | POA: Diagnosis not present

## 2021-03-26 DIAGNOSIS — M79605 Pain in left leg: Secondary | ICD-10-CM | POA: Diagnosis not present

## 2021-06-04 DIAGNOSIS — D2272 Melanocytic nevi of left lower limb, including hip: Secondary | ICD-10-CM | POA: Diagnosis not present

## 2021-06-04 DIAGNOSIS — C44519 Basal cell carcinoma of skin of other part of trunk: Secondary | ICD-10-CM | POA: Diagnosis not present

## 2021-06-04 DIAGNOSIS — D485 Neoplasm of uncertain behavior of skin: Secondary | ICD-10-CM | POA: Diagnosis not present

## 2021-06-04 DIAGNOSIS — L738 Other specified follicular disorders: Secondary | ICD-10-CM | POA: Diagnosis not present

## 2021-06-04 DIAGNOSIS — L918 Other hypertrophic disorders of the skin: Secondary | ICD-10-CM | POA: Diagnosis not present

## 2021-06-04 DIAGNOSIS — L814 Other melanin hyperpigmentation: Secondary | ICD-10-CM | POA: Diagnosis not present

## 2021-07-04 DIAGNOSIS — Z03818 Encounter for observation for suspected exposure to other biological agents ruled out: Secondary | ICD-10-CM | POA: Diagnosis not present

## 2021-07-04 DIAGNOSIS — J069 Acute upper respiratory infection, unspecified: Secondary | ICD-10-CM | POA: Diagnosis not present

## 2021-07-08 DIAGNOSIS — J019 Acute sinusitis, unspecified: Secondary | ICD-10-CM | POA: Diagnosis not present

## 2021-08-26 DIAGNOSIS — M791 Myalgia, unspecified site: Secondary | ICD-10-CM | POA: Diagnosis not present

## 2021-08-26 DIAGNOSIS — Z0000 Encounter for general adult medical examination without abnormal findings: Secondary | ICD-10-CM | POA: Diagnosis not present

## 2021-08-26 DIAGNOSIS — E7800 Pure hypercholesterolemia, unspecified: Secondary | ICD-10-CM | POA: Diagnosis not present

## 2021-08-26 DIAGNOSIS — F419 Anxiety disorder, unspecified: Secondary | ICD-10-CM | POA: Diagnosis not present

## 2021-08-26 DIAGNOSIS — Z125 Encounter for screening for malignant neoplasm of prostate: Secondary | ICD-10-CM | POA: Diagnosis not present

## 2022-02-19 IMAGING — MR MR LUMBAR SPINE W/O CM
4 of 5 series · 19 of 48 positions shown · non-contrast
Comparison: MRI lumbar spine 07/05/2020.

CLINICAL DATA: Patient status post lumbar surgery 1 week ago.
Lumbar radiculopathy.

EXAM:
MRI LUMBAR SPINE WITHOUT CONTRAST
TECHNIQUE: Multiplanar, multisequence MR imaging of the lumbar spine was
performed. No intravenous contrast was administered.

[Series 2: T2 · sagittal · 4.0mm · 0.55mm/px · 5 of 17 slices shown (1 of 2)]
[im 1/17]
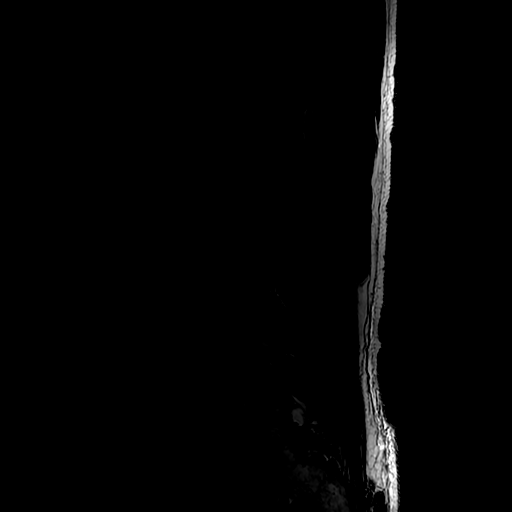
[im 5/17]
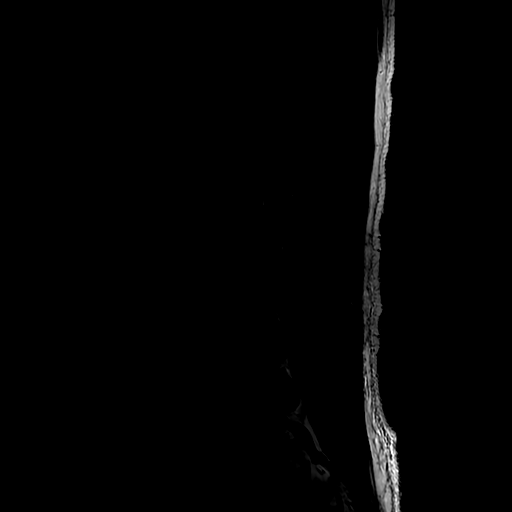
[im 9/17]
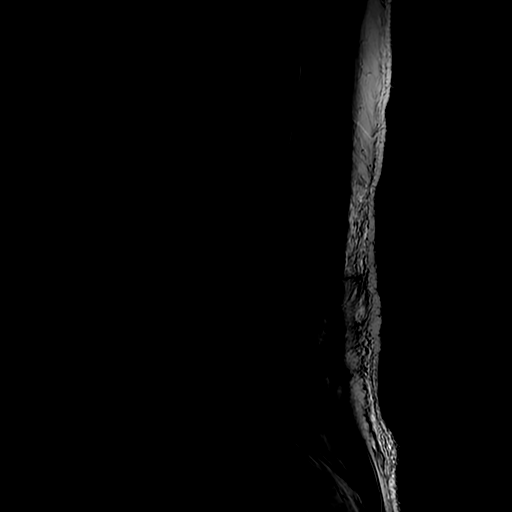
[im 13/17]
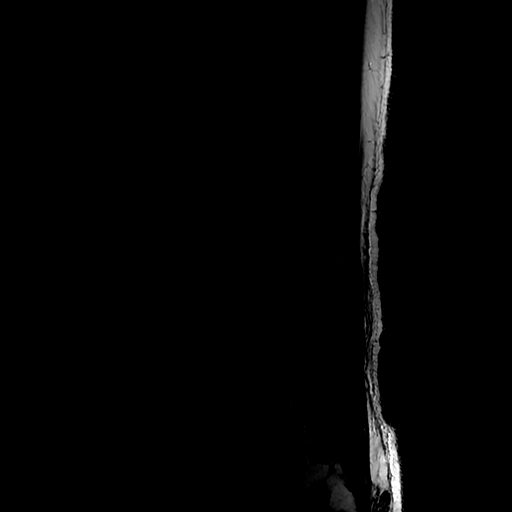
[im 17/17]
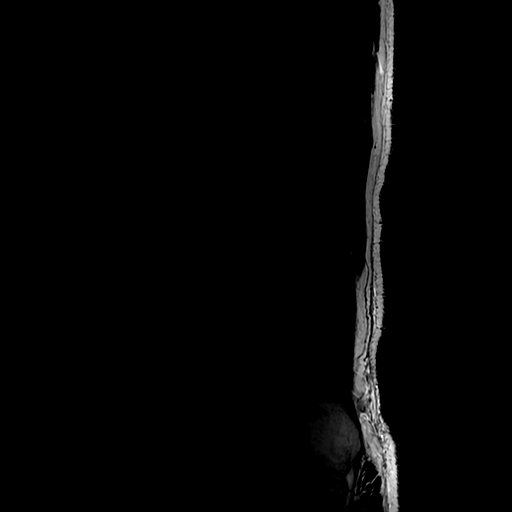

[Series 4: T1 · sagittal · 4.0mm · 0.55mm/px · 3 of 17 slices shown (1 of 2)]
[im 4/17]
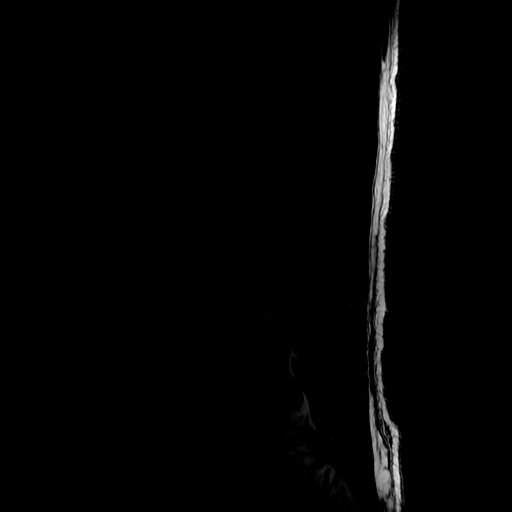
[im 10/17]
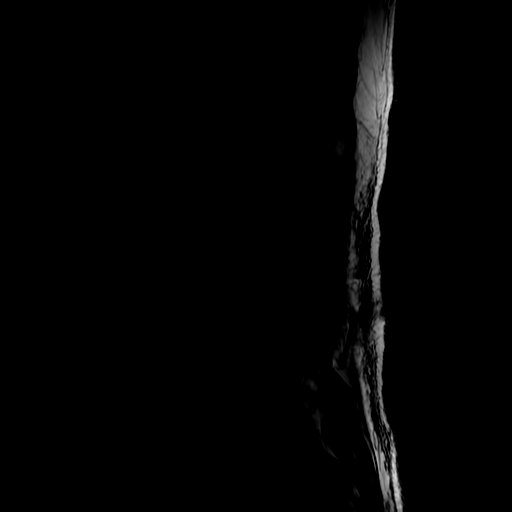
[im 17/17]
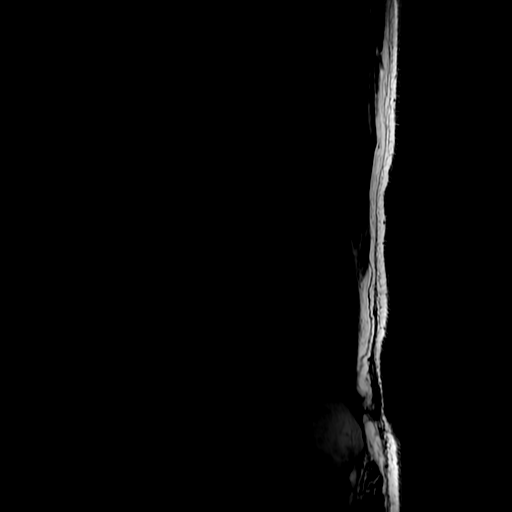

[Series 5: T2 · axial · 4.0mm · 0.39mm/px · z∈[-53,+126]mm · 8 of 47 slices shown (2 of 2)]
[im 4/47]
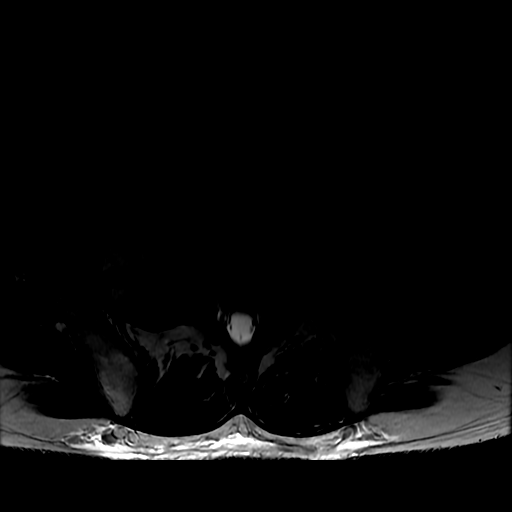
[im 7/47]
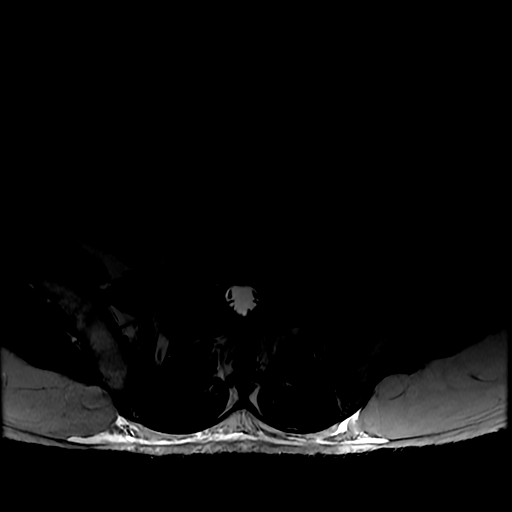
[im 10/47]
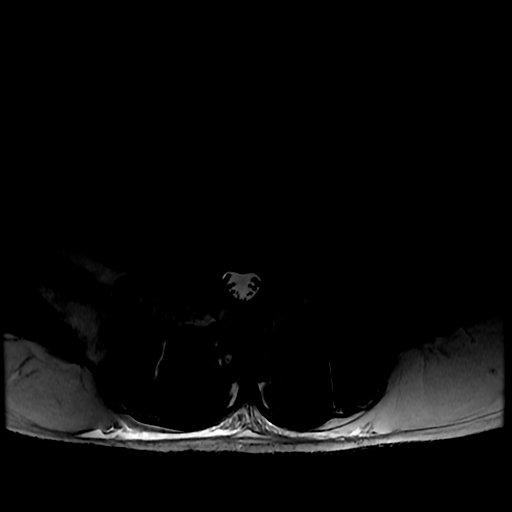
[im 16/47]
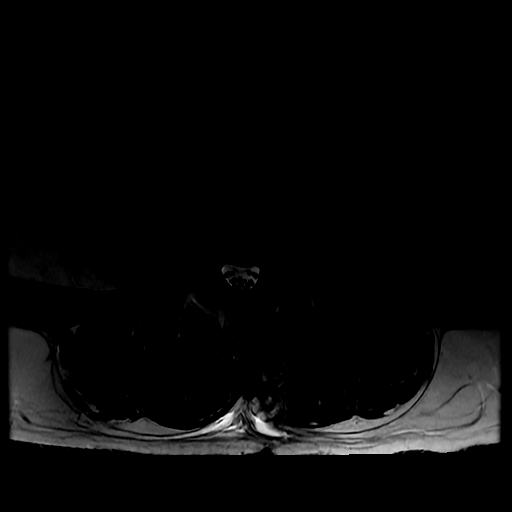
[im 22/47]
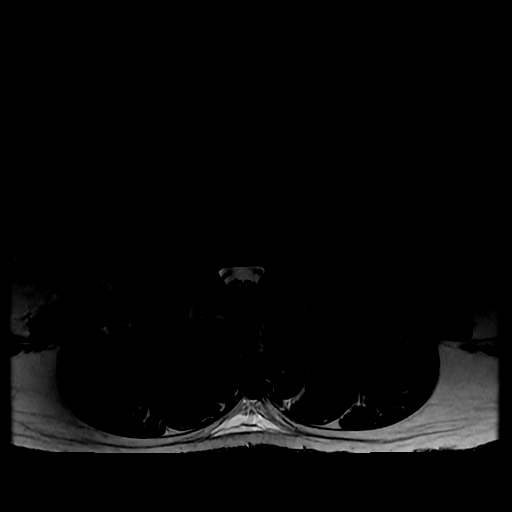
[im 25/47]
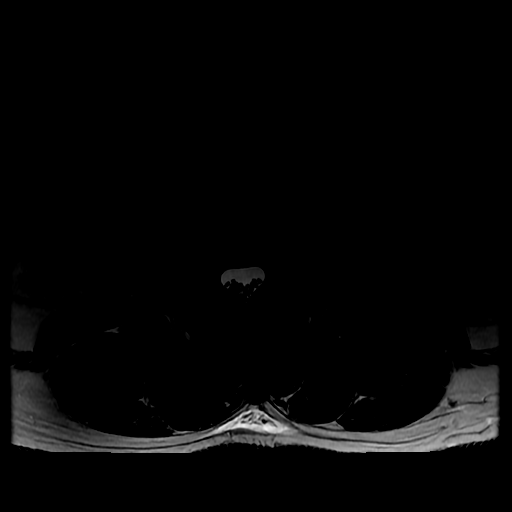
[im 28/47]
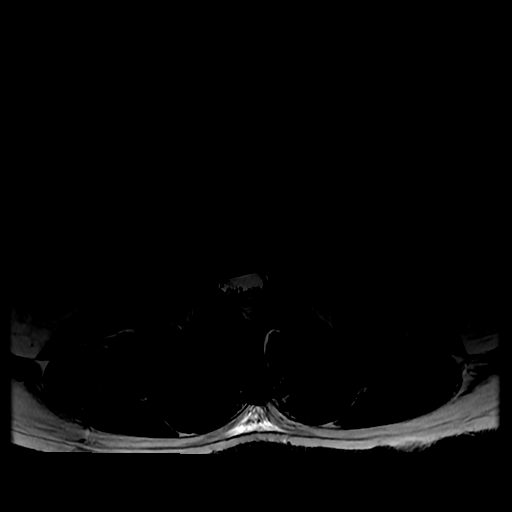
[im 40/47]
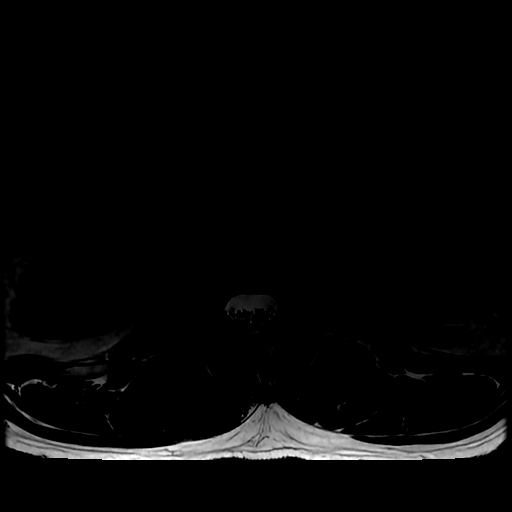

[Series 6: T1 · axial · 4.0mm · 0.39mm/px · z∈[-38,+126]mm · 3 of 47 slices shown (2 of 2)]
[im 7/47]
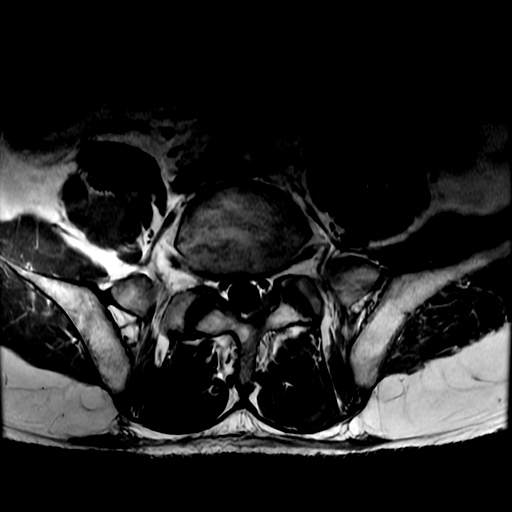
[im 25/47]
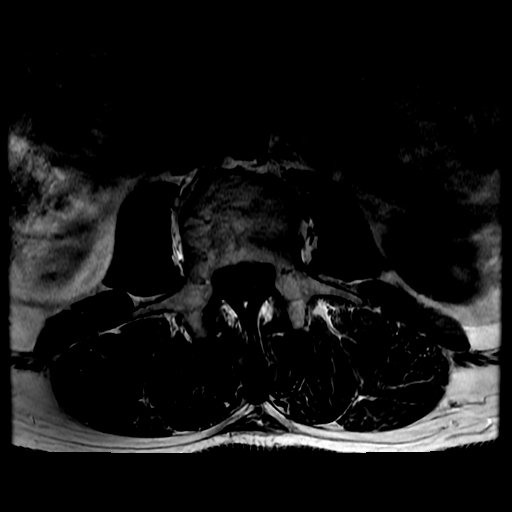
[im 40/47]
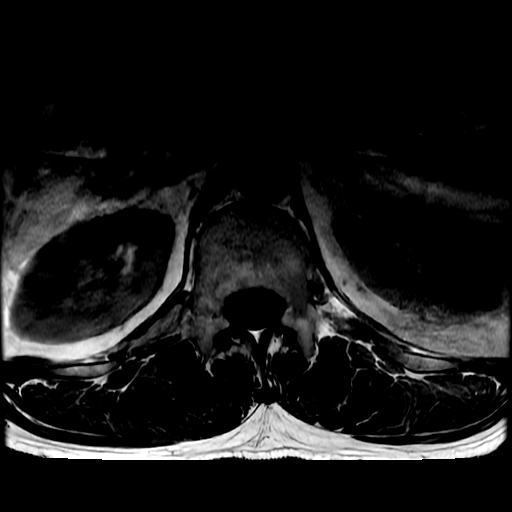

[19 of 48 positions shown; findings below may reference images not displayed]

FINDINGS: Segmentation:  Standard.

Alignment:  Normal.

Vertebrae:  No fracture, evidence of discitis, or bone lesion.

Conus medullaris and cauda equina: Conus extends to the L1 level.
Conus and cauda equina appear normal.

Paraspinal and other soft tissues: Negative.

Disc levels:

T12-L1: Negative.

L1-2: Shallow central protrusion with cephalad extension is
unchanged. The central canal and foramina are open.

L2-3: Shallow disc bulge without stenosis, unchanged.

L3-4: Shallow disc bulge and mild facet arthropathy. Disc contacting
the exiting right L3 root again seen. The central canal and left
foramen are open.

L4-5: The patient has undergone left laminotomy and partial
facetectomy. A small fluid collection in the surgical site is likely
a seroma measuring 2.2 cm AP by 0.8 cm transverse by 0.5 cm
craniocaudal. Shallow broad-based disc bulge is again seen.
Narrowing in the left subarticular recess appears improved. Mild to
moderate central canal stenosis is unchanged. Mild to moderate left
foraminal narrowing appears new. The right foramen is open.

L5-S1: Negative.
IMPRESSION: Postoperative change on the left at L4-5 as described. Small fluid
collection in the surgical site is likely a seroma. Narrowing in the
left subarticular recess appears improved. Mild to moderate left
foraminal narrowing is new. Mild to moderate central canal stenosis
overall is unchanged.

## 2022-03-11 DIAGNOSIS — H43811 Vitreous degeneration, right eye: Secondary | ICD-10-CM | POA: Diagnosis not present

## 2022-03-11 DIAGNOSIS — H31001 Unspecified chorioretinal scars, right eye: Secondary | ICD-10-CM | POA: Diagnosis not present

## 2022-04-02 DIAGNOSIS — H43811 Vitreous degeneration, right eye: Secondary | ICD-10-CM | POA: Diagnosis not present

## 2022-04-02 DIAGNOSIS — H31001 Unspecified chorioretinal scars, right eye: Secondary | ICD-10-CM | POA: Diagnosis not present

## 2022-04-02 DIAGNOSIS — H2513 Age-related nuclear cataract, bilateral: Secondary | ICD-10-CM | POA: Diagnosis not present

## 2022-06-30 DIAGNOSIS — M7711 Lateral epicondylitis, right elbow: Secondary | ICD-10-CM | POA: Diagnosis not present

## 2022-09-15 DIAGNOSIS — Z125 Encounter for screening for malignant neoplasm of prostate: Secondary | ICD-10-CM | POA: Diagnosis not present

## 2022-09-15 DIAGNOSIS — D649 Anemia, unspecified: Secondary | ICD-10-CM | POA: Diagnosis not present

## 2022-09-15 DIAGNOSIS — F419 Anxiety disorder, unspecified: Secondary | ICD-10-CM | POA: Diagnosis not present

## 2022-09-15 DIAGNOSIS — Z0000 Encounter for general adult medical examination without abnormal findings: Secondary | ICD-10-CM | POA: Diagnosis not present

## 2022-09-15 DIAGNOSIS — I700 Atherosclerosis of aorta: Secondary | ICD-10-CM | POA: Diagnosis not present

## 2022-09-15 DIAGNOSIS — E7800 Pure hypercholesterolemia, unspecified: Secondary | ICD-10-CM | POA: Diagnosis not present

## 2022-10-13 DIAGNOSIS — D649 Anemia, unspecified: Secondary | ICD-10-CM | POA: Diagnosis not present

## 2022-10-13 DIAGNOSIS — E782 Mixed hyperlipidemia: Secondary | ICD-10-CM | POA: Diagnosis not present

## 2022-11-06 DIAGNOSIS — F419 Anxiety disorder, unspecified: Secondary | ICD-10-CM | POA: Diagnosis not present

## 2022-11-06 DIAGNOSIS — R454 Irritability and anger: Secondary | ICD-10-CM | POA: Diagnosis not present

## 2023-01-28 DIAGNOSIS — L821 Other seborrheic keratosis: Secondary | ICD-10-CM | POA: Diagnosis not present

## 2023-01-28 DIAGNOSIS — L309 Dermatitis, unspecified: Secondary | ICD-10-CM | POA: Diagnosis not present

## 2023-05-18 DIAGNOSIS — H43813 Vitreous degeneration, bilateral: Secondary | ICD-10-CM | POA: Diagnosis not present

## 2023-05-18 DIAGNOSIS — H2513 Age-related nuclear cataract, bilateral: Secondary | ICD-10-CM | POA: Diagnosis not present

## 2023-08-13 DIAGNOSIS — Z20822 Contact with and (suspected) exposure to covid-19: Secondary | ICD-10-CM | POA: Diagnosis not present

## 2023-08-13 DIAGNOSIS — J069 Acute upper respiratory infection, unspecified: Secondary | ICD-10-CM | POA: Diagnosis not present

## 2023-09-21 DIAGNOSIS — D649 Anemia, unspecified: Secondary | ICD-10-CM | POA: Diagnosis not present

## 2023-09-21 DIAGNOSIS — F419 Anxiety disorder, unspecified: Secondary | ICD-10-CM | POA: Diagnosis not present

## 2023-09-21 DIAGNOSIS — E7800 Pure hypercholesterolemia, unspecified: Secondary | ICD-10-CM | POA: Diagnosis not present

## 2023-09-21 DIAGNOSIS — Z125 Encounter for screening for malignant neoplasm of prostate: Secondary | ICD-10-CM | POA: Diagnosis not present

## 2023-09-21 DIAGNOSIS — Z0000 Encounter for general adult medical examination without abnormal findings: Secondary | ICD-10-CM | POA: Diagnosis not present

## 2023-09-30 DIAGNOSIS — S60413A Abrasion of left middle finger, initial encounter: Secondary | ICD-10-CM | POA: Diagnosis not present

## 2023-12-20 DIAGNOSIS — M9903 Segmental and somatic dysfunction of lumbar region: Secondary | ICD-10-CM | POA: Diagnosis not present

## 2023-12-20 DIAGNOSIS — M9901 Segmental and somatic dysfunction of cervical region: Secondary | ICD-10-CM | POA: Diagnosis not present

## 2023-12-20 DIAGNOSIS — M9905 Segmental and somatic dysfunction of pelvic region: Secondary | ICD-10-CM | POA: Diagnosis not present

## 2023-12-20 DIAGNOSIS — M7541 Impingement syndrome of right shoulder: Secondary | ICD-10-CM | POA: Diagnosis not present

## 2023-12-20 DIAGNOSIS — M9902 Segmental and somatic dysfunction of thoracic region: Secondary | ICD-10-CM | POA: Diagnosis not present

## 2023-12-24 DIAGNOSIS — M9903 Segmental and somatic dysfunction of lumbar region: Secondary | ICD-10-CM | POA: Diagnosis not present

## 2023-12-24 DIAGNOSIS — M7541 Impingement syndrome of right shoulder: Secondary | ICD-10-CM | POA: Diagnosis not present

## 2023-12-24 DIAGNOSIS — M9901 Segmental and somatic dysfunction of cervical region: Secondary | ICD-10-CM | POA: Diagnosis not present

## 2023-12-24 DIAGNOSIS — M9902 Segmental and somatic dysfunction of thoracic region: Secondary | ICD-10-CM | POA: Diagnosis not present

## 2023-12-24 DIAGNOSIS — M9905 Segmental and somatic dysfunction of pelvic region: Secondary | ICD-10-CM | POA: Diagnosis not present

## 2023-12-29 DIAGNOSIS — M9902 Segmental and somatic dysfunction of thoracic region: Secondary | ICD-10-CM | POA: Diagnosis not present

## 2023-12-29 DIAGNOSIS — M9905 Segmental and somatic dysfunction of pelvic region: Secondary | ICD-10-CM | POA: Diagnosis not present

## 2023-12-29 DIAGNOSIS — M7541 Impingement syndrome of right shoulder: Secondary | ICD-10-CM | POA: Diagnosis not present

## 2023-12-29 DIAGNOSIS — M9901 Segmental and somatic dysfunction of cervical region: Secondary | ICD-10-CM | POA: Diagnosis not present

## 2023-12-29 DIAGNOSIS — M9903 Segmental and somatic dysfunction of lumbar region: Secondary | ICD-10-CM | POA: Diagnosis not present

## 2024-01-05 DIAGNOSIS — M7541 Impingement syndrome of right shoulder: Secondary | ICD-10-CM | POA: Diagnosis not present

## 2024-01-05 DIAGNOSIS — M9902 Segmental and somatic dysfunction of thoracic region: Secondary | ICD-10-CM | POA: Diagnosis not present

## 2024-01-05 DIAGNOSIS — M9901 Segmental and somatic dysfunction of cervical region: Secondary | ICD-10-CM | POA: Diagnosis not present

## 2024-01-05 DIAGNOSIS — M9903 Segmental and somatic dysfunction of lumbar region: Secondary | ICD-10-CM | POA: Diagnosis not present

## 2024-01-05 DIAGNOSIS — M9905 Segmental and somatic dysfunction of pelvic region: Secondary | ICD-10-CM | POA: Diagnosis not present

## 2024-01-12 DIAGNOSIS — M9902 Segmental and somatic dysfunction of thoracic region: Secondary | ICD-10-CM | POA: Diagnosis not present

## 2024-01-12 DIAGNOSIS — M9901 Segmental and somatic dysfunction of cervical region: Secondary | ICD-10-CM | POA: Diagnosis not present

## 2024-01-12 DIAGNOSIS — M9903 Segmental and somatic dysfunction of lumbar region: Secondary | ICD-10-CM | POA: Diagnosis not present

## 2024-01-12 DIAGNOSIS — M9905 Segmental and somatic dysfunction of pelvic region: Secondary | ICD-10-CM | POA: Diagnosis not present

## 2024-01-12 DIAGNOSIS — M7541 Impingement syndrome of right shoulder: Secondary | ICD-10-CM | POA: Diagnosis not present

## 2024-01-19 DIAGNOSIS — M9905 Segmental and somatic dysfunction of pelvic region: Secondary | ICD-10-CM | POA: Diagnosis not present

## 2024-01-19 DIAGNOSIS — M9903 Segmental and somatic dysfunction of lumbar region: Secondary | ICD-10-CM | POA: Diagnosis not present

## 2024-01-19 DIAGNOSIS — M9901 Segmental and somatic dysfunction of cervical region: Secondary | ICD-10-CM | POA: Diagnosis not present

## 2024-01-19 DIAGNOSIS — M9902 Segmental and somatic dysfunction of thoracic region: Secondary | ICD-10-CM | POA: Diagnosis not present

## 2024-01-19 DIAGNOSIS — M7541 Impingement syndrome of right shoulder: Secondary | ICD-10-CM | POA: Diagnosis not present

## 2024-05-22 DIAGNOSIS — H2513 Age-related nuclear cataract, bilateral: Secondary | ICD-10-CM | POA: Diagnosis not present

## 2024-05-22 DIAGNOSIS — H43813 Vitreous degeneration, bilateral: Secondary | ICD-10-CM | POA: Diagnosis not present
